# Patient Record
Sex: Male | Born: 1980 | Race: White | Hispanic: Yes | Marital: Married | Smoking: Current every day smoker
Health system: Southern US, Community
[De-identification: ages and names within clinical notes are randomized; demographics above are authoritative.]

## PROBLEM LIST (undated history)

## (undated) DIAGNOSIS — G4730 Sleep apnea, unspecified: Secondary | ICD-10-CM

## (undated) DIAGNOSIS — E119 Type 2 diabetes mellitus without complications: Secondary | ICD-10-CM

## (undated) DIAGNOSIS — S61011A Laceration without foreign body of right thumb without damage to nail, initial encounter: Secondary | ICD-10-CM

## (undated) DIAGNOSIS — S66829A Laceration of other specified muscles, fascia and tendons at wrist and hand level, unspecified hand, initial encounter: Secondary | ICD-10-CM

## (undated) HISTORY — PX: NO PAST SURGERIES: SHX2092

---

## 2008-03-30 ENCOUNTER — Ambulatory Visit: Payer: Self-pay | Admitting: Internal Medicine

## 2009-03-27 ENCOUNTER — Ambulatory Visit: Payer: Self-pay | Admitting: Internal Medicine

## 2009-10-26 ENCOUNTER — Ambulatory Visit: Payer: Self-pay | Admitting: Internal Medicine

## 2016-01-03 ENCOUNTER — Encounter: Payer: Self-pay | Admitting: Emergency Medicine

## 2016-01-03 DIAGNOSIS — Y9389 Activity, other specified: Secondary | ICD-10-CM | POA: Insufficient documentation

## 2016-01-03 DIAGNOSIS — Y9289 Other specified places as the place of occurrence of the external cause: Secondary | ICD-10-CM | POA: Diagnosis not present

## 2016-01-03 DIAGNOSIS — E119 Type 2 diabetes mellitus without complications: Secondary | ICD-10-CM | POA: Insufficient documentation

## 2016-01-03 DIAGNOSIS — W228XXA Striking against or struck by other objects, initial encounter: Secondary | ICD-10-CM | POA: Diagnosis not present

## 2016-01-03 DIAGNOSIS — Y990 Civilian activity done for income or pay: Secondary | ICD-10-CM | POA: Diagnosis not present

## 2016-01-03 DIAGNOSIS — S61011A Laceration without foreign body of right thumb without damage to nail, initial encounter: Secondary | ICD-10-CM

## 2016-01-03 DIAGNOSIS — S66829A Laceration of other specified muscles, fascia and tendons at wrist and hand level, unspecified hand, initial encounter: Secondary | ICD-10-CM

## 2016-01-03 DIAGNOSIS — Z23 Encounter for immunization: Secondary | ICD-10-CM | POA: Diagnosis not present

## 2016-01-03 DIAGNOSIS — F17200 Nicotine dependence, unspecified, uncomplicated: Secondary | ICD-10-CM | POA: Insufficient documentation

## 2016-01-03 HISTORY — DX: Laceration of other specified muscles, fascia and tendons at wrist and hand level, unspecified hand, initial encounter: S66.829A

## 2016-01-03 HISTORY — DX: Laceration without foreign body of right thumb without damage to nail, initial encounter: S61.011A

## 2016-01-03 NOTE — ED Notes (Signed)
Spoke with patient's boss Marinda ElkJason Ford [facility] [phone]. Barbara CowerJason states that he would like to have a Urine Drug Screen.

## 2016-01-03 NOTE — ED Notes (Signed)
Pt with laceration to right medial anterior thumb from sheet metal at work. Pt states is not able to bend thumb, cap refill intact. Dressing applied in triage, bleeding controlled.

## 2016-01-03 NOTE — ED Notes (Signed)
Worker's Comp urine collected and dropped off in lab.

## 2016-01-04 ENCOUNTER — Emergency Department: Payer: Worker's Compensation

## 2016-01-04 ENCOUNTER — Emergency Department
Admission: EM | Admit: 2016-01-04 | Discharge: 2016-01-04 | Disposition: A | Discharge status: Home / Self Care | Payer: Worker's Compensation | Attending: Emergency Medicine | Admitting: Emergency Medicine

## 2016-01-04 DIAGNOSIS — IMO0002 Reserved for concepts with insufficient information to code with codable children: Secondary | ICD-10-CM

## 2016-01-04 HISTORY — DX: Sleep apnea, unspecified: G47.30

## 2016-01-04 MED ORDER — LIDOCAINE HCL (PF) 1 % IJ SOLN
INTRAMUSCULAR | Status: AC
  Administered 2016-01-04: 5 mL
  Filled 2016-01-04: qty 5

## 2016-01-04 MED ORDER — TETANUS-DIPHTH-ACELL PERTUSSIS 5-2.5-18.5 LF-MCG/0.5 IM SUSP
0.5000 mL | Freq: Once | INTRAMUSCULAR | Status: AC
Start: 2016-01-04 — End: 2016-01-04
  Administered 2016-01-04: 0.5 mL via INTRAMUSCULAR

## 2016-01-04 MED ORDER — TETANUS-DIPHTH-ACELL PERTUSSIS 5-2.5-18.5 LF-MCG/0.5 IM SUSP
INTRAMUSCULAR | Status: AC
  Administered 2016-01-04: 0.5 mL via INTRAMUSCULAR
  Filled 2016-01-04: qty 0.5

## 2016-01-04 MED ORDER — LIDOCAINE HCL (PF) 1 % IJ SOLN
5.0000 mL | Freq: Once | INTRAMUSCULAR | Status: AC
  Administered 2016-01-04: 5 mL

## 2016-01-04 NOTE — ED Provider Notes (Signed)
[facility] Emergency Department Provider Note    ____________________________________________  Time seen: ~0100  I have reviewed the triage vital signs and the nursing notes.   HISTORY  Chief Complaint Laceration   History limited by: Not Limited   HPI Henry Graham is a 35 y.o. male who presents to the emergency department today because of concerns for right thumb pain. The patient states that he fell at work roughly 4 hours ago. He hit his thumb against a metal trough. He denies any other significant injury. He did suffer a laceration to the volar aspect of his right thumb. Did have some initial bleeding however that the stone. He has had pain. This moderate. Unsure when his last tetanus shot was.   Past Medical History  Diagnosis Date  . Diabetes mellitus without complication (HCC)   . Sleep apnea     There are no active problems to display for this patient.   History reviewed. No pertinent past surgical history.  No current outpatient prescriptions on file.  Allergies Review of patient's allergies indicates no known allergies.  No family history on file.  Social History Social History  Substance Use Topics  . Smoking status: Current Every Day Smoker  . Smokeless tobacco: Never Used  . Alcohol Use: Yes    Review of Systems  Constitutional: Negative for fever. Cardiovascular: Negative for chest pain. Respiratory: Negative for shortness of breath. Gastrointestinal: Negative for abdominal pain, vomiting and diarrhea. Neurological: Negative for headaches, focal weakness or numbness.  10-point ROS otherwise negative.  ____________________________________________   PHYSICAL EXAM:  VITAL SIGNS: ED Triage Vitals  Enc Vitals Group     BP 01/03/16 2211 128/81 mmHg     Pulse Rate 01/03/16 2211 67     Resp 01/03/16 2211 16     Temp 01/03/16 2211 98.3 F (36.8 C)     Temp Source 01/03/16 2211 Oral     SpO2 01/03/16 2211 100  %     Weight 01/03/16 2211 230 lb (104.327 kg)     Height 01/03/16 2211 6' (1.829 m)     Head Cir --      Peak Flow --      Pain Score 01/03/16 2212 0   Constitutional: Alert and oriented. Well appearing and in no distress. Eyes: Conjunctivae are normal. PERRL. Normal extraocular movements. ENT   Head: Normocephalic and atraumatic.   Nose: No congestion/rhinnorhea.   Mouth/Throat: Mucous membranes are moist.   Neck: No stridor. Hematological/Lymphatic/Immunilogical: No cervical lymphadenopathy. Cardiovascular: Normal rate, regular rhythm.  No murmurs, rubs, or gallops. Respiratory: Normal respiratory effort without tachypnea nor retractions. Breath sounds are clear and equal bilaterally. No wheezes/rales/rhonchi. Gastrointestinal: Soft and nontender. No distention.  Genitourinary: Deferred Musculoskeletal: Right thumb with small roughly 1.5 cm laceration to the volar surface. Sensation intact. Patient unable to flex at IP joint. No foreign body seen in laceration. No tendon  visualize on examination. Neurologic:  Normal speech and language. No gross focal neurologic deficits are appreciated.  Skin:  Skin is warm, dry and intact. No rash noted. Psychiatric: Mood and affect are normal. Speech and behavior are normal. Patient exhibits appropriate insight and judgment.  ____________________________________________    LABS (pertinent positives/negatives)  Labs Reviewed - No data to display   ____________________________________________   EKG  None  ____________________________________________    RADIOLOGY  Rigth thumb IMPRESSION: No acute osseous abnormality.  ____________________________________________   PROCEDURES  Procedure(s) performed: Laceration repair, see procedure note(s).  Critical Care performed: No  LACERATION REPAIR Performed by: Phineas Semen Authorized by: Phineas Semen Consent: Verbal consent obtained. Risks and benefits:  risks, benefits and alternatives were discussed Consent given by: patient Patient identity confirmed: provided demographic data Prepped and Draped in normal sterile fashion Wound explored  Laceration Location: right thumb  Laceration Length: 1.5 cm  No Foreign Bodies seen or palpated  Anesthesia: digital nerve block  Anesthetic: lidocaine 1% without epinephrine  Anesthetic total: 1 ml  Irrigation method: syringe Amount of cleaning: standard  Skin closure: 5-0 vicryl rapide  Number of sutures: 3  Technique: simple interrupted  Patient tolerance: Patient tolerated the procedure well with no immediate complications.  ____________________________________________   INITIAL IMPRESSION / ASSESSMENT AND PLAN / ED COURSE  Pertinent labs & imaging results that were available during my care of the patient were reviewed by me and considered in my medical decision making (see chart for details).  Patient presented to the emergency department today because of concerns for laceration to right thumb. On exam patient does have roughly 1.5 cm laceration to the volar aspect. X-ray did not show any osseous injury. Did have some concern for flexor tendon injury given that patient is not able to flex at the IP joint. Discussed with orthopedic doctor on call who recommended loose closure and clinic follow-up on Monday. Will give patient name of hand surgeon on call for [facility].  ____________________________________________   FINAL CLINICAL IMPRESSION(S) / ED DIAGNOSES  Final diagnoses:  Laceration     Phineas Semen, MD 01/04/16 0246

## 2016-01-04 NOTE — ED Notes (Signed)
Dr. Derrill KayGoodman at bedside for lac repair

## 2016-01-04 NOTE — Discharge Instructions (Signed)
As we discussed please give Dr. Carollee Massedhompson's office a call Monday morning to schedule an appointment. Please seek medical attention for any high fevers, chest pain, shortness of breath, change in behavior, persistent vomiting, bloody stool or any other new or concerning symptoms.

## 2016-01-10 ENCOUNTER — Other Ambulatory Visit: Payer: Self-pay | Admitting: Orthopedic Surgery

## 2016-01-10 ENCOUNTER — Encounter (HOSPITAL_BASED_OUTPATIENT_CLINIC_OR_DEPARTMENT_OTHER): Payer: Self-pay | Admitting: *Deleted

## 2016-01-10 NOTE — H&P (Signed)
Henry Graham is an 35 y.o. male.   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  He states that he slipped and fell and lacerated his thumb while at work at his part-time job.  He went to the emergency department where he was evaluated and found to have lacerated his flexor tendon of his right thumb.  The laceration was washed and he was loosely sutured, given a tetanus shot and asked to followup with us.  The patient is a type II diabetic.  Past Medical History  Diagnosis Date  . Sleep apnea     uses CPAP nightly  . Diabetes mellitus type 2, noninsulin dependent (HCC)   . Laceration of thumb, right, with tendon involvement 01/03/2016  . Laceration of flexor tendon of hand 01/03/2016    right thumb    Past Surgical History  Procedure Laterality Date  . No past surgeries      History reviewed. No pertinent family history. Social History:  reports that he has been smoking Cigarettes.  He has a 6.5 pack-year smoking history. He has never used smokeless tobacco. He reports that he drinks alcohol. He reports that he does not use illicit drugs.  Allergies: No Known Allergies  No prescriptions prior to admission    No results found for this or any previous visit (from the past 48 hour(s)). No results found.  Review of Systems  All other systems reviewed and are negative.   Height 6' (1.829 m), weight 104.327 kg (230 lb). Physical Exam  Constitutional:  WD, WN, NAD HEENT:  NCAT, EOMI Neuro/Psych:  Alert & oriented to person, place, and time; appropriate mood & affect Lymphatic: No generalized UE edema or lymphadenopathy Extremities / MSK:  Both UE are normal with respect to appearance, ranges of motion, joint stability, muscle strength/tone, sensation, & perfusion except as otherwise noted:  The right thumb has  a laceration with 3 Vicryl stitches just proximal to the volar aspect of the IP joint.  He is approximately 1-1/2-2 cm length.  The MP joint has full motion, however the IP joint rests in a bit of extension and is unable to actively flex.  Light touch sensibility on both the ulnar and radial aspects of the thumb is intact.  Monofilament testing on both radial and ulnar is 2.83.  Labs / Xrays:  No radiographic studies obtained today.  Assessment: Right thumb laceration with FPL laceration  Plan:  Findings were discussed with the patient at length.  He will proceed with flexor tendon repair of the right thumb.  The details of the operative procedure were discussed with the patient.  Questions were invited and answered.  In addition to the goal of the procedure, the risks of the procedure to include but not limited to bleeding; infection; damage to the nerves or blood vessels that could result in bleeding, numbness, weakness, chronic pain, and the need for additional procedures; stiffness; the need for revision surgery; and anesthetic risks were reviewed.  No specific outcome was guaranteed or implied.  Informed consent was obtained.   Ximena Todaro A., MD 01/10/2016, 5:34 PM

## 2016-01-13 ENCOUNTER — Ambulatory Visit (HOSPITAL_BASED_OUTPATIENT_CLINIC_OR_DEPARTMENT_OTHER): Payer: Worker's Compensation | Admitting: Anesthesiology

## 2016-01-13 ENCOUNTER — Ambulatory Visit (HOSPITAL_BASED_OUTPATIENT_CLINIC_OR_DEPARTMENT_OTHER)
Admission: RE | Admit: 2016-01-13 | Discharge: 2016-01-13 | Disposition: A | Discharge status: Home / Self Care | Payer: Worker's Compensation | Source: Ambulatory Visit | Attending: Orthopedic Surgery | Admitting: Orthopedic Surgery

## 2016-01-13 ENCOUNTER — Encounter (HOSPITAL_BASED_OUTPATIENT_CLINIC_OR_DEPARTMENT_OTHER): Payer: Self-pay | Admitting: Anesthesiology

## 2016-01-13 ENCOUNTER — Encounter (HOSPITAL_BASED_OUTPATIENT_CLINIC_OR_DEPARTMENT_OTHER)
Admission: RE | Disposition: A | Discharge status: Home / Self Care | Payer: Self-pay | Source: Ambulatory Visit | Attending: Orthopedic Surgery

## 2016-01-13 DIAGNOSIS — E119 Type 2 diabetes mellitus without complications: Secondary | ICD-10-CM | POA: Diagnosis not present

## 2016-01-13 DIAGNOSIS — S66021A Laceration of long flexor muscle, fascia and tendon of right thumb at wrist and hand level, initial encounter: Secondary | ICD-10-CM | POA: Diagnosis not present

## 2016-01-13 DIAGNOSIS — G4730 Sleep apnea, unspecified: Secondary | ICD-10-CM | POA: Insufficient documentation

## 2016-01-13 DIAGNOSIS — Z6830 Body mass index (BMI) 30.0-30.9, adult: Secondary | ICD-10-CM | POA: Insufficient documentation

## 2016-01-13 DIAGNOSIS — Z7984 Long term (current) use of oral hypoglycemic drugs: Secondary | ICD-10-CM | POA: Insufficient documentation

## 2016-01-13 DIAGNOSIS — W010XXA Fall on same level from slipping, tripping and stumbling without subsequent striking against object, initial encounter: Secondary | ICD-10-CM | POA: Diagnosis not present

## 2016-01-13 DIAGNOSIS — Z79899 Other long term (current) drug therapy: Secondary | ICD-10-CM | POA: Insufficient documentation

## 2016-01-13 HISTORY — DX: Laceration of other specified muscles, fascia and tendons at wrist and hand level, unspecified hand, initial encounter: S66.829A

## 2016-01-13 HISTORY — PX: FLEXOR TENDON REPAIR: SHX6501

## 2016-01-13 HISTORY — DX: Laceration without foreign body of right thumb without damage to nail, initial encounter: S61.011A

## 2016-01-13 HISTORY — DX: Type 2 diabetes mellitus without complications: E11.9

## 2016-01-13 LAB — GLUCOSE, CAPILLARY: GLUCOSE-CAPILLARY: 91 mg/dL (ref 65–99)

## 2016-01-13 LAB — POCT I-STAT, CHEM 8
BUN: 17 mg/dL (ref 6–20)
CALCIUM ION: 0.98 mmol/L — AB (ref 1.12–1.23)
CREATININE: 0.9 mg/dL (ref 0.61–1.24)
Chloride: 108 mmol/L (ref 101–111)
GLUCOSE: 97 mg/dL (ref 65–99)
HCT: 52 % (ref 39.0–52.0)
HEMOGLOBIN: 17.7 g/dL — AB (ref 13.0–17.0)
POTASSIUM: 3.7 mmol/L (ref 3.5–5.1)
Sodium: 142 mmol/L (ref 135–145)
TCO2: 22 mmol/L (ref 0–100)

## 2016-01-13 SURGERY — REPAIR, TENDON, FLEXOR
Anesthesia: General | Site: Thumb | Laterality: Right

## 2016-01-13 MED ORDER — CEFAZOLIN SODIUM-DEXTROSE 2-4 GM/100ML-% IV SOLN
2.0000 g | INTRAVENOUS | Status: AC
  Administered 2016-01-13: 2 g via INTRAVENOUS

## 2016-01-13 MED ORDER — KETOROLAC TROMETHAMINE 30 MG/ML IJ SOLN
INTRAMUSCULAR | Status: DC | PRN
  Administered 2016-01-13: 30 mg via INTRAVENOUS

## 2016-01-13 MED ORDER — MIDAZOLAM HCL 2 MG/2ML IJ SOLN
1.0000 mg | INTRAMUSCULAR | Status: DC | PRN
  Administered 2016-01-13: 2 mg via INTRAVENOUS

## 2016-01-13 MED ORDER — MIDAZOLAM HCL 2 MG/2ML IJ SOLN
INTRAMUSCULAR | Status: AC
  Filled 2016-01-13: qty 2

## 2016-01-13 MED ORDER — ONDANSETRON HCL 4 MG/2ML IJ SOLN
INTRAMUSCULAR | Status: AC
  Filled 2016-01-13: qty 2

## 2016-01-13 MED ORDER — KETOROLAC TROMETHAMINE 30 MG/ML IJ SOLN
INTRAMUSCULAR | Status: AC
  Filled 2016-01-13: qty 1

## 2016-01-13 MED ORDER — LIDOCAINE HCL (CARDIAC) 20 MG/ML IV SOLN
INTRAVENOUS | Status: DC | PRN
  Administered 2016-01-13: 80 mg via INTRAVENOUS

## 2016-01-13 MED ORDER — FENTANYL CITRATE (PF) 100 MCG/2ML IJ SOLN
INTRAMUSCULAR | Status: AC
  Filled 2016-01-13: qty 2

## 2016-01-13 MED ORDER — CEFAZOLIN SODIUM-DEXTROSE 2-4 GM/100ML-% IV SOLN
INTRAVENOUS | Status: AC
  Filled 2016-01-13: qty 100

## 2016-01-13 MED ORDER — SCOPOLAMINE 1 MG/3DAYS TD PT72: 1.0000 | MEDICATED_PATCH | Freq: Once | TRANSDERMAL | Status: DC | PRN

## 2016-01-13 MED ORDER — BUPIVACAINE-EPINEPHRINE 0.5% -1:200000 IJ SOLN
INTRAMUSCULAR | Status: DC | PRN
  Administered 2016-01-13: 10 mL

## 2016-01-13 MED ORDER — DEXAMETHASONE SODIUM PHOSPHATE 10 MG/ML IJ SOLN
INTRAMUSCULAR | Status: AC
Start: 2016-01-13 — End: 2016-01-13
  Filled 2016-01-13: qty 1

## 2016-01-13 MED ORDER — OXYCODONE-ACETAMINOPHEN 5-325 MG PO TABS: 1.0000 | ORAL_TABLET | Freq: Four times a day (QID) | ORAL | Status: DC | PRN

## 2016-01-13 MED ORDER — OXYCODONE HCL 5 MG PO TABS: 5.0000 mg | ORAL_TABLET | Freq: Once | ORAL | Status: DC | PRN

## 2016-01-13 MED ORDER — DEXAMETHASONE SODIUM PHOSPHATE 4 MG/ML IJ SOLN
INTRAMUSCULAR | Status: DC | PRN
  Administered 2016-01-13: 10 mg via INTRAVENOUS

## 2016-01-13 MED ORDER — HYDROMORPHONE HCL 1 MG/ML IJ SOLN: 0.2500 mg | INTRAMUSCULAR | Status: DC | PRN

## 2016-01-13 MED ORDER — PROPOFOL 10 MG/ML IV BOLUS
INTRAVENOUS | Status: AC
  Filled 2016-01-13: qty 40

## 2016-01-13 MED ORDER — FENTANYL CITRATE (PF) 100 MCG/2ML IJ SOLN
50.0000 ug | INTRAMUSCULAR | Status: DC | PRN
  Administered 2016-01-13: 100 ug via INTRAVENOUS
  Administered 2016-01-13: 50 ug via INTRAVENOUS

## 2016-01-13 MED ORDER — BUPIVACAINE-EPINEPHRINE (PF) 0.5% -1:200000 IJ SOLN
INTRAMUSCULAR | Status: AC
  Filled 2016-01-13: qty 90

## 2016-01-13 MED ORDER — PROPOFOL 10 MG/ML IV BOLUS
INTRAVENOUS | Status: DC | PRN
  Administered 2016-01-13: 50 mg via INTRAVENOUS
  Administered 2016-01-13: 300 mg via INTRAVENOUS

## 2016-01-13 MED ORDER — ONDANSETRON HCL 4 MG/2ML IJ SOLN
INTRAMUSCULAR | Status: DC | PRN
  Administered 2016-01-13: 4 mg via INTRAVENOUS

## 2016-01-13 MED ORDER — LIDOCAINE HCL (CARDIAC) 20 MG/ML IV SOLN
INTRAVENOUS | Status: AC
  Filled 2016-01-13: qty 5

## 2016-01-13 MED ORDER — OXYCODONE HCL 5 MG/5ML PO SOLN
5.0000 mg | Freq: Once | ORAL | Status: DC | PRN
Start: 2016-01-13 — End: 2016-01-13

## 2016-01-13 MED ORDER — GLYCOPYRROLATE 0.2 MG/ML IJ SOLN: 0.2000 mg | Freq: Once | INTRAMUSCULAR | Status: DC | PRN

## 2016-01-13 MED ORDER — 0.9 % SODIUM CHLORIDE (POUR BTL) OPTIME
TOPICAL | Status: DC | PRN
  Administered 2016-01-13: 100 mL

## 2016-01-13 MED ORDER — MEPERIDINE HCL 25 MG/ML IJ SOLN: 6.2500 mg | INTRAMUSCULAR | Status: DC | PRN

## 2016-01-13 MED ORDER — PROPOFOL 10 MG/ML IV BOLUS
INTRAVENOUS | Status: AC
  Filled 2016-01-13: qty 20

## 2016-01-13 MED ORDER — DEXAMETHASONE SODIUM PHOSPHATE 10 MG/ML IJ SOLN
INTRAMUSCULAR | Status: AC
  Filled 2016-01-13: qty 1

## 2016-01-13 MED ORDER — LACTATED RINGERS IV SOLN: INTRAVENOUS | Status: DC

## 2016-01-13 MED ORDER — LACTATED RINGERS IV SOLN
INTRAVENOUS | Status: DC
  Administered 2016-01-13: 08:00:00 via INTRAVENOUS

## 2016-01-13 SURGICAL SUPPLY — 64 items
BLADE MINI RND TIP GREEN BEAV (BLADE) IMPLANT
BLADE SURG 15 STRL LF DISP TIS (BLADE) ×1 IMPLANT
BLADE SURG 15 STRL SS (BLADE) ×2
BNDG COHESIVE 4X5 TAN STRL (GAUZE/BANDAGES/DRESSINGS) ×3 IMPLANT
BNDG ESMARK 4X9 LF (GAUZE/BANDAGES/DRESSINGS) ×3 IMPLANT
BNDG GAUZE ELAST 4 BULKY (GAUZE/BANDAGES/DRESSINGS) ×6 IMPLANT
CHLORAPREP W/TINT 26ML (MISCELLANEOUS) ×3 IMPLANT
CORDS BIPOLAR (ELECTRODE) ×3 IMPLANT
COVER BACK TABLE 60X90IN (DRAPES) ×3 IMPLANT
COVER MAYO STAND STRL (DRAPES) ×3 IMPLANT
CUFF TOURNIQUET SINGLE 18IN (TOURNIQUET CUFF) IMPLANT
DECANTER SPIKE VIAL GLASS SM (MISCELLANEOUS) IMPLANT
DRAIN PENROSE 1/4X12 LTX STRL (WOUND CARE) IMPLANT
DRAPE EXTREMITY T 121X128X90 (DRAPE) ×3 IMPLANT
DRAPE SURG 17X23 STRL (DRAPES) ×3 IMPLANT
DRSG EMULSION OIL 3X3 NADH (GAUZE/BANDAGES/DRESSINGS) ×3 IMPLANT
ELECT REM PT RETURN 9FT ADLT (ELECTROSURGICAL)
ELECTRODE REM PT RTRN 9FT ADLT (ELECTROSURGICAL) IMPLANT
GAUZE SPONGE 4X4 12PLY STRL (GAUZE/BANDAGES/DRESSINGS) ×3 IMPLANT
GLOVE BIO SURGEON STRL SZ7.5 (GLOVE) ×3 IMPLANT
GLOVE BIOGEL M STRL SZ7.5 (GLOVE) ×3 IMPLANT
GLOVE BIOGEL PI IND STRL 7.0 (GLOVE) IMPLANT
GLOVE BIOGEL PI IND STRL 8 (GLOVE) ×2 IMPLANT
GLOVE BIOGEL PI INDICATOR 7.0 (GLOVE)
GLOVE BIOGEL PI INDICATOR 8 (GLOVE) ×4
GLOVE ECLIPSE 6.5 STRL STRAW (GLOVE) IMPLANT
GOWN STRL REUS W/ TWL LRG LVL3 (GOWN DISPOSABLE) ×2 IMPLANT
GOWN STRL REUS W/TWL LRG LVL3 (GOWN DISPOSABLE) ×4
GOWN STRL REUS W/TWL XL LVL3 (GOWN DISPOSABLE) ×6 IMPLANT
LOOP VESSEL MAXI BLUE (MISCELLANEOUS) IMPLANT
LOOP VESSEL MINI RED (MISCELLANEOUS) IMPLANT
NEEDLE HYPO 25X1 1.5 SAFETY (NEEDLE) IMPLANT
NS IRRIG 1000ML POUR BTL (IV SOLUTION) ×3 IMPLANT
PACK BASIN DAY SURGERY FS (CUSTOM PROCEDURE TRAY) ×3 IMPLANT
PAD CAST 4YDX4 CTTN HI CHSV (CAST SUPPLIES) ×1 IMPLANT
PADDING CAST ABS 4INX4YD NS (CAST SUPPLIES) ×2
PADDING CAST ABS COTTON 4X4 ST (CAST SUPPLIES) ×1 IMPLANT
PADDING CAST COTTON 4X4 STRL (CAST SUPPLIES) ×2
PENCIL BUTTON HOLSTER BLD 10FT (ELECTRODE) IMPLANT
RUBBERBAND STERILE (MISCELLANEOUS) ×3 IMPLANT
SLEEVE SCD COMPRESS KNEE MED (MISCELLANEOUS) ×3 IMPLANT
SLING ARM FOAM STRAP LRG (SOFTGOODS) IMPLANT
SLING ARM FOAM STRAP XLG (SOFTGOODS) ×3 IMPLANT
SPLINT PLASTER CAST XFAST 3X15 (CAST SUPPLIES) ×10 IMPLANT
SPLINT PLASTER XTRA FASTSET 3X (CAST SUPPLIES) ×20
STOCKINETTE 6  STRL (DRAPES) ×2
STOCKINETTE 6 STRL (DRAPES) ×1 IMPLANT
SUT FIBERWIRE 2-0 18 17.9 3/8 (SUTURE)
SUT MERSILENE 4 0 P 3 (SUTURE) IMPLANT
SUT PROLENE 6 0 P 1 18 (SUTURE) IMPLANT
SUT SILK 4 0 SH CR/8 (SUTURE) IMPLANT
SUT STEEL 4 (SUTURE) IMPLANT
SUT SUPRAMID 3-0 (SUTURE) IMPLANT
SUT VICRYL 3-0 CR8 SH (SUTURE) IMPLANT
SUT VICRYL RAPIDE 4-0 (SUTURE) IMPLANT
SUT VICRYL RAPIDE 4/0 PS 2 (SUTURE) ×3 IMPLANT
SUTURE FIBERWR 2-0 18 17.9 3/8 (SUTURE) IMPLANT
SYR BULB 3OZ (MISCELLANEOUS) ×3 IMPLANT
SYRINGE 10CC LL (SYRINGE) ×3 IMPLANT
TOWEL OR 17X24 6PK STRL BLUE (TOWEL DISPOSABLE) ×3 IMPLANT
TUBE CONNECTING 20'X1/4 (TUBING)
TUBE CONNECTING 20X1/4 (TUBING) IMPLANT
TUBE FEEDING 8FR 16IN STR KANG (MISCELLANEOUS) ×3 IMPLANT
UNDERPAD 30X30 (UNDERPADS AND DIAPERS) ×3 IMPLANT

## 2016-01-13 NOTE — Anesthesia Postprocedure Evaluation (Signed)
Anesthesia Post Note  Patient: Salley SlaughterGuillermo D Tamplin  Procedure(s) Performed: Procedure(s) (LRB): RIGHT THUMB FLEXOR TENDON REPAIR (Right)  Patient location during evaluation: PACU Anesthesia Type: General Level of consciousness: awake and alert Pain management: pain level controlled Vital Signs Assessment: post-procedure vital signs reviewed and stable Respiratory status: spontaneous breathing, nonlabored ventilation and respiratory function stable Cardiovascular status: blood pressure returned to baseline and stable Postop Assessment: no signs of nausea or vomiting Anesthetic complications: no    Last Vitals:  Filed Vitals:   01/13/16 1230 01/13/16 1318  BP: 115/62 125/81  Pulse: 69 66  Temp:  36.4 C  Resp: 13 16    Last Pain:  Filed Vitals:   01/13/16 1319  PainSc: 2                  Dorean Hiebert A

## 2016-01-13 NOTE — Discharge Instructions (Signed)
Discharge Instructions   You have a dressing with a plaster splint incorporated in it. Move your fingers as much as possible, making a full fist and fully opening the fist. Elevate your hand to reduce pain & swelling of the digits.  Ice over the operative site may be helpful to reduce pain & swelling.  DO NOT USE HEAT. Pain medicine has been prescribed for you.  Use your medicine as needed over the first 48 hours, and then you can begin to taper your use.  You may use Tylenol in place of your prescribed pain medication, but not IN ADDITION to it. Leave the dressing in place until you return to our office.  You may shower, but keep the bandage clean & dry.  You may drive a car when you are off of prescription pain medications and can safely control your vehicle with both hands. Our office will call you to arrange follow-up   Please call [phone] during normal business hours or [phone] after hours for any problems. Including the following:  - excessive redness of the incisions - drainage for more than 4 days - fever of more than 101.5 F  *Please note that pain medications will not be refilled after hours or on weekends.  WORK STATUS: NO WORK UNTIL AT LEAST THE FIRST POSTOP VISIT IN APPROXIMATELY 7-10 DAYS.     Post Anesthesia Home Care Instructions  Activity: Get plenty of rest for the remainder of the day. A responsible adult should stay with you for 24 hours following the procedure.  For the next 24 hours, DO NOT: -Drive a car -Advertising copywriterperate machinery -Drink alcoholic beverages -Take any medication unless instructed by your physician -Make any legal decisions or sign important papers.  Meals: Start with liquid foods such as gelatin or soup. Progress to regular foods as tolerated. Avoid greasy, spicy, heavy foods. If nausea and/or vomiting occur, drink only clear liquids until the nausea and/or vomiting subsides. Call your physician if vomiting continues.  Special  Instructions/Symptoms: Your throat may feel dry or sore from the anesthesia or the breathing tube placed in your throat during surgery. If this causes discomfort, gargle with warm salt water. The discomfort should disappear within 24 hours.  If you had a scopolamine patch placed behind your ear for the management of post- operative nausea and/or vomiting:  1. The medication in the patch is effective for 72 hours, after which it should be removed.  Wrap patch in a tissue and discard in the trash. Wash hands thoroughly with soap and water. 2. You may remove the patch earlier than 72 hours if you experience unpleasant side effects which may include dry mouth, dizziness or visual disturbances. 3. Avoid touching the patch. Wash your hands with soap and water after contact with the patch.

## 2016-01-13 NOTE — Anesthesia Preprocedure Evaluation (Signed)
Anesthesia Evaluation  Patient identified by MRN, date of birth, ID band Patient awake    Reviewed: Allergy & Precautions, NPO status , Patient's Chart, lab work & pertinent test results  Airway Mallampati: I  TM Distance: >3 FB Neck ROM: Full    Dental  (+) Teeth Intact, Dental Advisory Given   Pulmonary sleep apnea and Continuous Positive Airway Pressure Ventilation , Current Smoker,    breath sounds clear to auscultation       Cardiovascular  Rhythm:Regular Rate:Normal     Neuro/Psych    GI/Hepatic   Endo/Other  diabetes, Well Controlled, Type 2, Oral Hypoglycemic AgentsMorbid obesity  Renal/GU      Musculoskeletal   Abdominal   Peds  Hematology   Anesthesia Other Findings   Reproductive/Obstetrics                             Anesthesia Physical Anesthesia Plan  ASA: III  Anesthesia Plan: General   Post-op Pain Management:    Induction: Intravenous  Airway Management Planned: LMA  Additional Equipment:   Intra-op Plan:   Post-operative Plan: Extubation in OR  Informed Consent: I have reviewed the patients History and Physical, chart, labs and discussed the procedure including the risks, benefits and alternatives for the proposed anesthesia with the patient or authorized representative who has indicated his/her understanding and acceptance.   Dental advisory given  Plan Discussed with: CRNA, Anesthesiologist and Surgeon  Anesthesia Plan Comments:         Anesthesia Quick Evaluation

## 2016-01-13 NOTE — Op Note (Signed)
01/13/2016  10:21 AM  PATIENT:  Henry Graham  35 y.o. male  PRE-OPERATIVE DIAGNOSIS:  Right thumb flexor tendon laceration  POST-OPERATIVE DIAGNOSIS:  Same  PROCEDURE:  Right thumb FPL repair in zone 2  SURGEON: Cliffton Astersavid A. Janee Mornhompson, MD  PHYSICIAN ASSISTANT: Danielle RankinKirsten Schrader, OPA-C  ANESTHESIA:  general  SPECIMENS:  None  DRAINS:   None  EBL:  less than 50 mL  PREOPERATIVE INDICATIONS:  Henry Graham is a  35 y.o. male with right thumb laceration and no active FPL function demonstrable.  The risks benefits and alternatives were discussed with the patient preoperatively including but not limited to the risks of infection, bleeding, nerve injury, cardiopulmonary complications, the need for revision surgery, among others, and the patient verbalized understanding and consented to proceed.  OPERATIVE IMPLANTS: 3-0 supramid and 6-0 prolene  OPERATIVE PROCEDURE:  After receiving prophylactic antibiotics, the patient was escorted to the operative theatre and placed in a supine position.  General anesthesia was administered.  A surgical "time-out" was performed during which the planned procedure, proposed operative site, and the correct patient identity were compared to the operative consent and agreement confirmed by the circulating nurse according to current facility policy.  Following application of a tourniquet to the operative extremity, the exposed skin was pre-scrubbed with a Hibiclens scrub brush before being formally prepped with Chloraprep and draped in the usual sterile fashion.  The limb was exsanguinated with an Esmarch bandage and the tourniquet inflated to approximately 100mmHg higher than systolic BP.  The previous placed sutures were removed.  The incision was extended proximally and distally in a combination of mid axial and Ball CorporationBruner lines.  The digital neurovascular structures were found to be intact.  The FPL was transected, with enough of the distal stump for direct  repair.  The proximal stump could not be seen were retrieved with a grasper.  Therefore an oblique incision was made in the distal wrist creases with dissection through the FCR sheath.  The FPL was identified and it was grasped.  It could not be pushed far enough distally to be retrieved so was pulled proximally after a pediatric feeding tube was placed from distal to proximal end was retrieved proximally.  The tendon had a 3-0 looped Supramid suture placed in it, and admitted to the pediatric feeding tube which was withdrawn distally, running with it the tendon.  Once the tendon was brought back to his native alignment, 25-gauge hypodermic needle was used to prevent it from retracting and then the repair was performed.  A 30 loop suture was used to affect a 4 strand grasping locking repair as described by Nedra HaiLee, followed by a running locked 6-0 Prolene epitendinous repair.  Hypodermic needle was removed, and the tendon moved appropriately with tenodesis effect.  The tourniquet was released, the wound is copiously irrigated and the skin was closed with 4-0 Vicryl Rapide interrupted sutures.  A bulky splint dressing was applied with a thumb spica plaster component and he was awakened and taken to room stable condition, breathing spontaneously.  DISPOSITION: He'll be discharged home today, returning in approximately a week, at which time we can examine the wounds and have him transition to therapy for a custom splint construction and initiation of flexor tendon rehabilitation for FPL in zone 2.

## 2016-01-13 NOTE — Interval H&P Note (Signed)
History and Physical Interval Note:  01/13/2016 10:20 AM  Henry SlaughterGuillermo D Graham  has presented today for surgery, with the diagnosis of RIGHT THIUMB  FLEXOR TENDON LACERATION S61.011A  The various methods of treatment have been discussed with the patient and family. After consideration of risks, benefits and other options for treatment, the patient has consented to  Procedure(s): RIGHT THUMB FLEXOR TENDON REPAIR (Right) as a surgical intervention .  The patient's history has been reviewed, patient examined, no change in status, stable for surgery.  I have reviewed the patient's chart and labs.  Questions were answered to the patient's satisfaction.     Kuulei Kleier A.

## 2016-01-13 NOTE — Transfer of Care (Signed)
Immediate Anesthesia Transfer of Care Note  Patient: Henry SlaughterGuillermo D Graham  Procedure(s) Performed: Procedure(s): RIGHT THUMB FLEXOR TENDON REPAIR (Right)  Patient Location: PACU  Anesthesia Type:General  Level of Consciousness: awake and sedated  Airway & Oxygen Therapy: Patient Spontanous Breathing and Patient connected to face mask oxygen  Post-op Assessment: Report given to RN and Post -op Vital signs reviewed and stable  Post vital signs: Reviewed and stable  Last Vitals:  Filed Vitals:   01/13/16 0823  BP: 137/71  Pulse: 67  Temp: 36.6 C  Resp: 16    Complications: No apparent anesthesia complications

## 2016-01-13 NOTE — Anesthesia Procedure Notes (Signed)
Procedure Name: LMA Insertion Performed by: Mikal Wisman W Pre-anesthesia Checklist: Patient identified, Emergency Drugs available, Suction available and Patient being monitored Patient Re-evaluated:Patient Re-evaluated prior to inductionOxygen Delivery Method: Circle System Utilized Preoxygenation: Pre-oxygenation with 100% oxygen Intubation Type: IV induction Ventilation: Mask ventilation without difficulty LMA: LMA inserted LMA Size: 5.0 Number of attempts: 1 Placement Confirmation: positive ETCO2 Tube secured with: Tape Dental Injury: Teeth and Oropharynx as per pre-operative assessment      

## 2016-01-14 ENCOUNTER — Encounter (HOSPITAL_BASED_OUTPATIENT_CLINIC_OR_DEPARTMENT_OTHER): Payer: Self-pay | Admitting: Orthopedic Surgery

## 2016-01-14 NOTE — Progress Notes (Signed)
Awaiting call from surgeons office to schedule follow up appointment

## 2017-12-29 IMAGING — CR DG FINGER THUMB 2+V*R*
1 series · 3 of 3 positions shown · non-contrast
Comparison: None.

CLINICAL DATA: Initial encounter for Pt with laceration to right
medial anterior thumb from sheet metal at work tonight. Pt states is
not able to bend thumb, no prior hx of injury.

EXAM:
RIGHT THUMB 2+V

[Series 1: dg finger thumb right · 0.14mm/px · 3 of 3 slices shown]
[im 1/3]
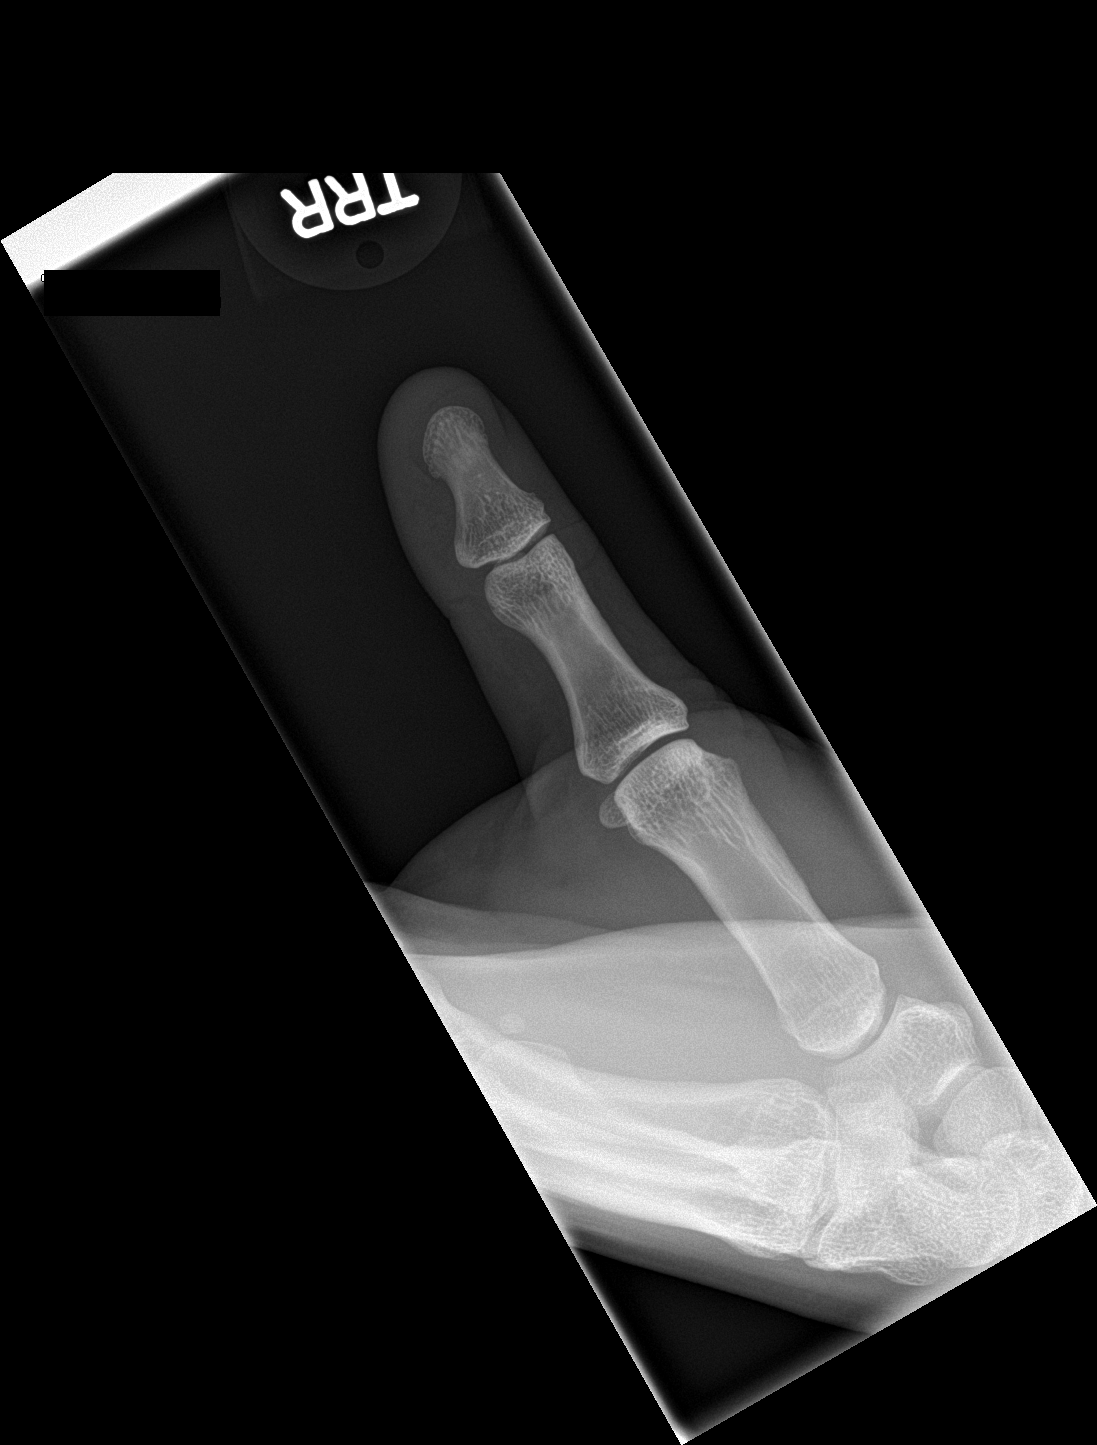
[im 2/3]
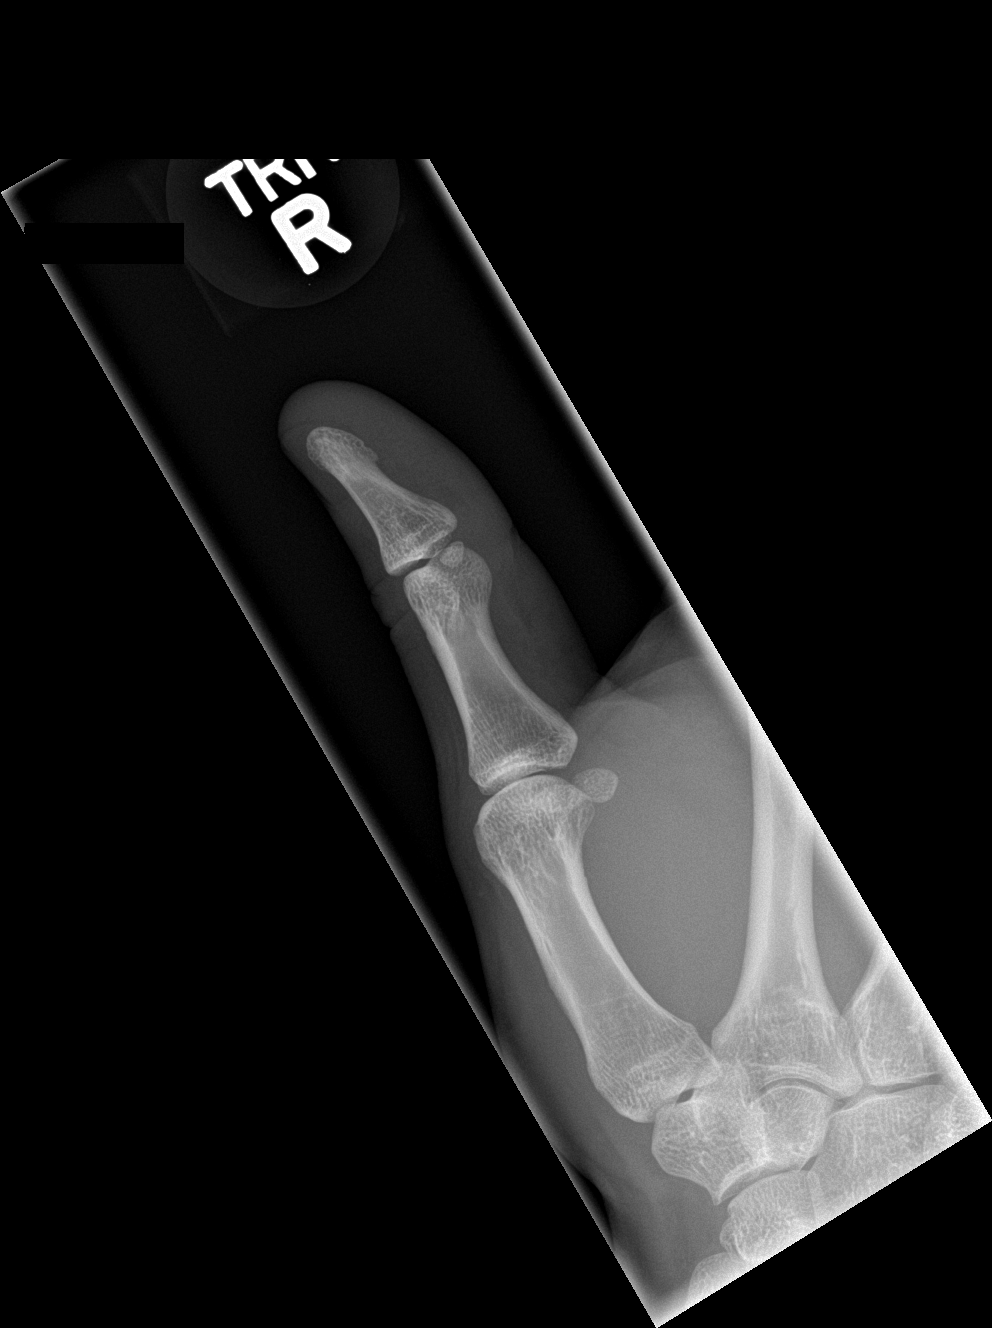
[im 3/3]
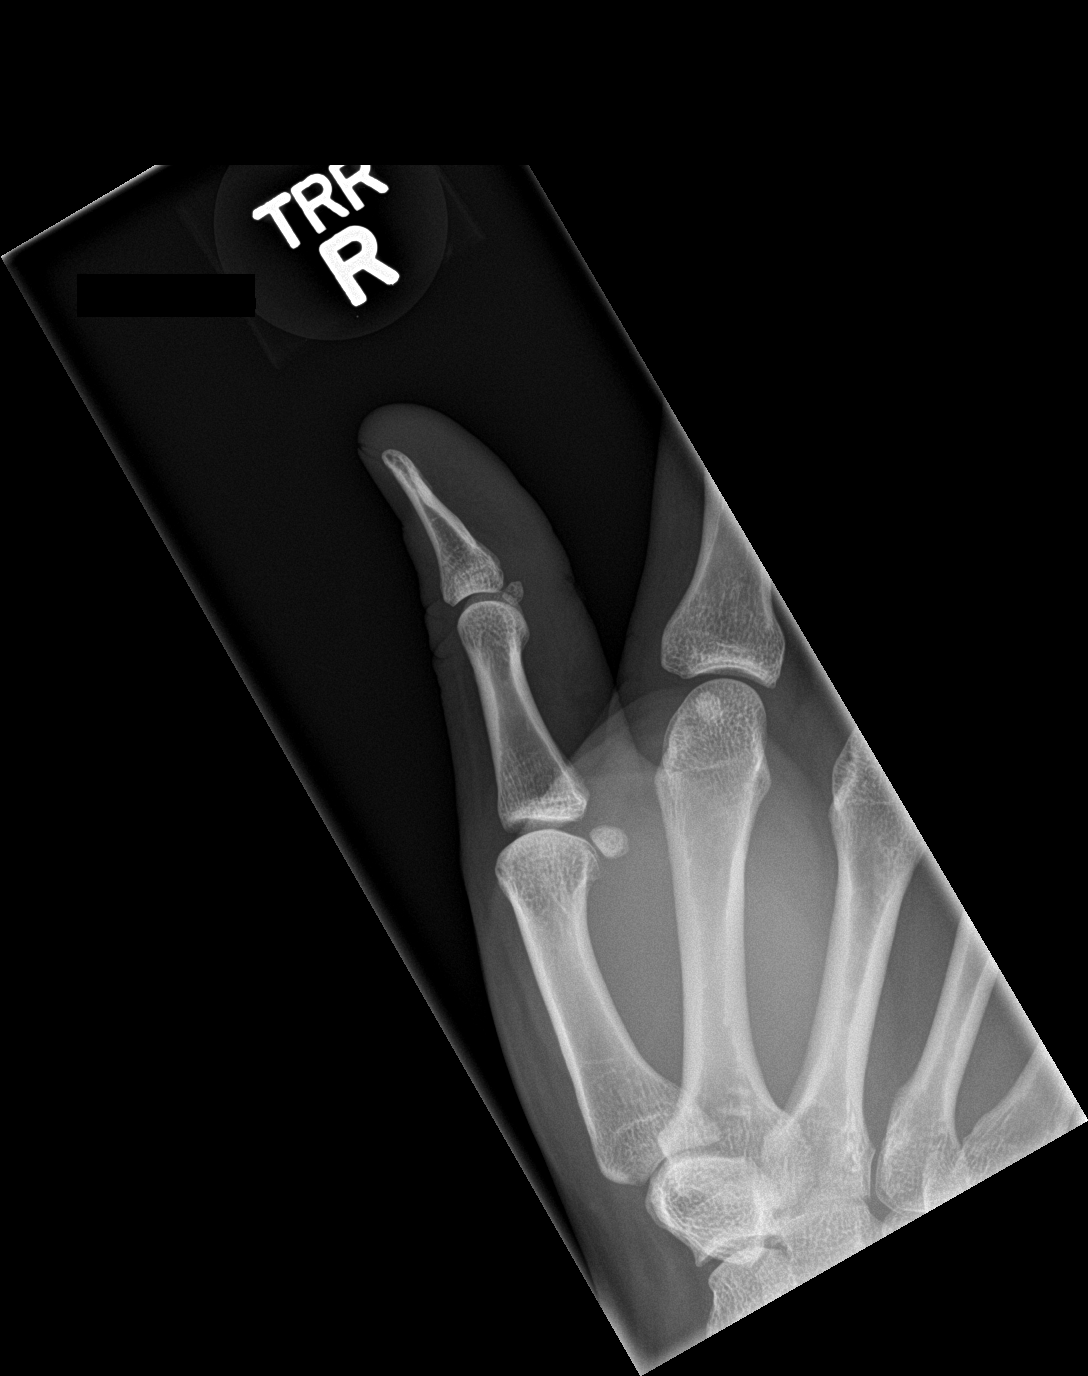

[3 of 3 positions shown; findings below may reference images not displayed]

FINDINGS: No acute fracture or dislocation.  No radiopaque foreign object.
IMPRESSION: No acute osseous abnormality.

## 2022-04-03 ENCOUNTER — Encounter: Payer: Self-pay | Admitting: Emergency Medicine

## 2022-04-03 ENCOUNTER — Ambulatory Visit
Admission: EM | Admit: 2022-04-03 | Discharge: 2022-04-03 | Disposition: A | Discharge status: Home / Self Care | Payer: BC Managed Care – PPO

## 2022-04-03 DIAGNOSIS — S29019A Strain of muscle and tendon of unspecified wall of thorax, initial encounter: Secondary | ICD-10-CM | POA: Diagnosis not present

## 2022-04-03 MED ORDER — IBUPROFEN 600 MG PO TABS: 600.0000 mg | ORAL_TABLET | Freq: Four times a day (QID) | ORAL | 0 refills | Status: AC | PRN

## 2022-04-03 MED ORDER — BACLOFEN 10 MG PO TABS: 10.0000 mg | ORAL_TABLET | Freq: Three times a day (TID) | ORAL | 0 refills | Status: AC
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
## 2022-04-03 NOTE — Discharge Instructions (Signed)
Take the ibuprofen, 600 mg every 6 hours with food, on a schedule for the next 48 hours and then as needed.  Take the baclofen, 10 mg every 6 hours, on a schedule for the next 48 hours and then as needed.  Apply moist heat to your back for 30 minutes at a time 2-3 times a day to improve blood flow to the area and help remove the lactic acid causing the spasm.  Follow the back exercises given at discharge.  Return for reevaluation for any new or worsening symptoms.
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
